# Patient Record
Sex: Male | Born: 1985 | Hispanic: Yes | Marital: Married
Health system: Northeastern US, Academic
[De-identification: ages and names within clinical notes are randomized; demographics above are authoritative.]

---

## 2010-08-02 IMAGING — US Abdomen
1 series · 14 of 16 positions shown · non-contrast
Comparison: none

[Series 1: abdomen · 0.31mm/px · 14 of 74 slices shown]
[im 1/74]
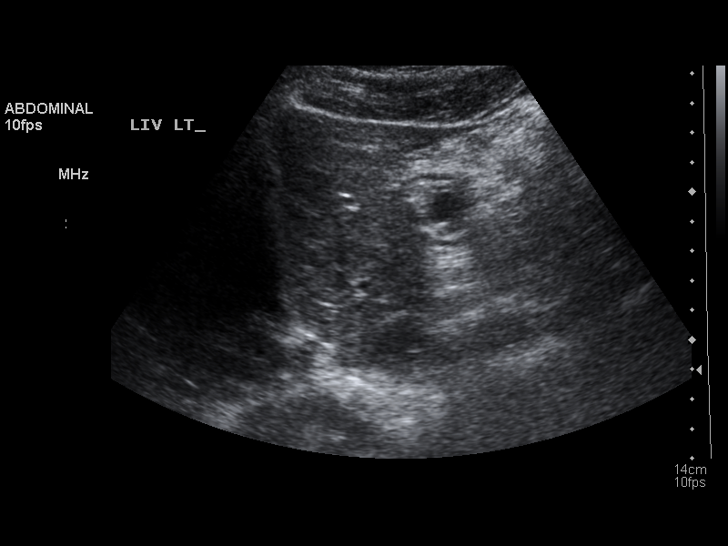
[im 5/74]
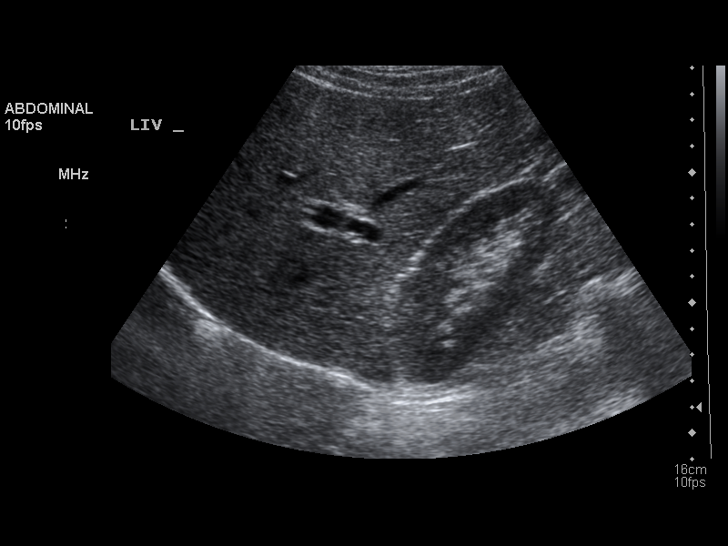
[im 10/74]
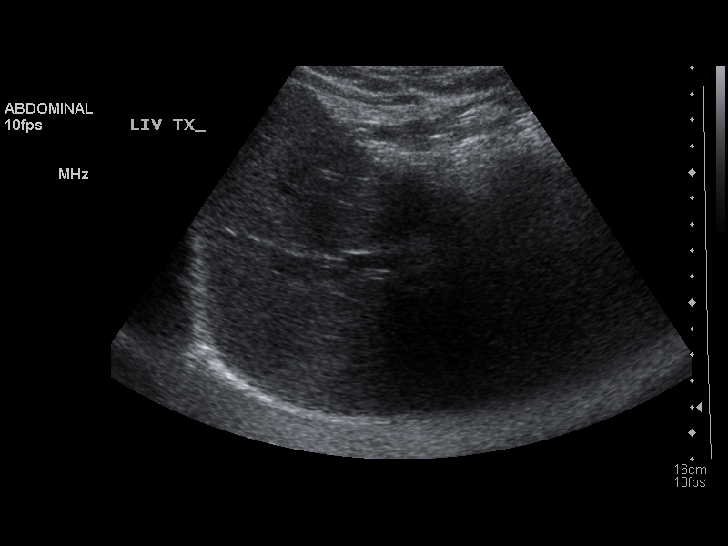
[im 20/74]
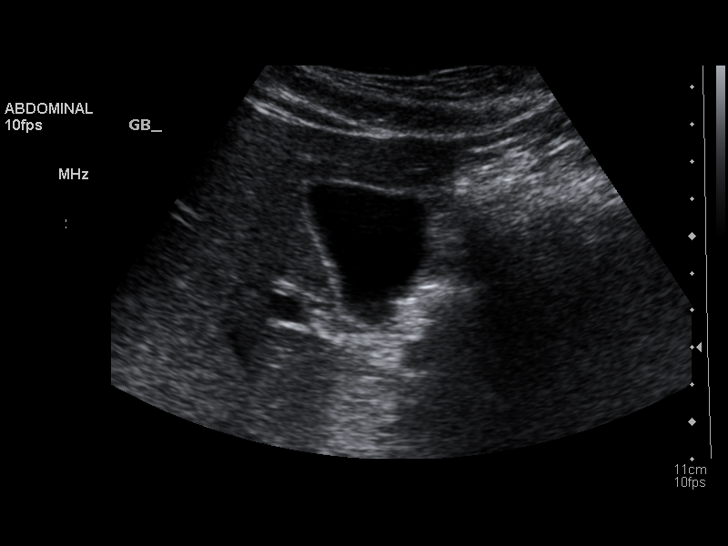
[im 25/74]
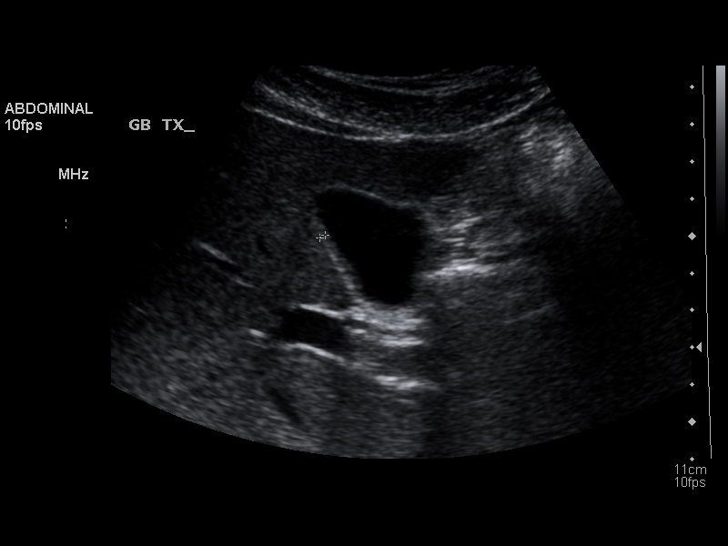
[im 30/74]
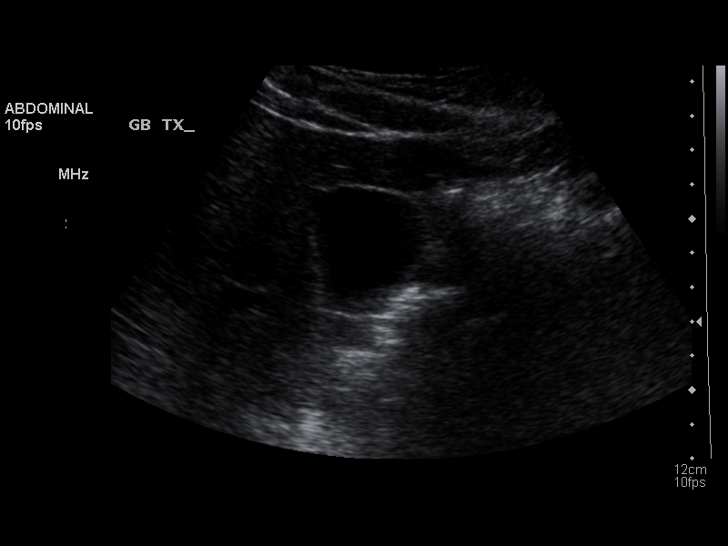
[im 35/74]
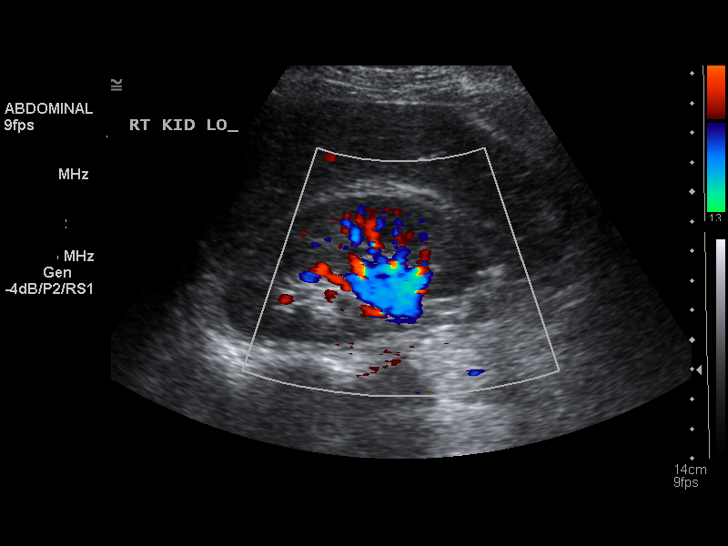
[im 39/74]
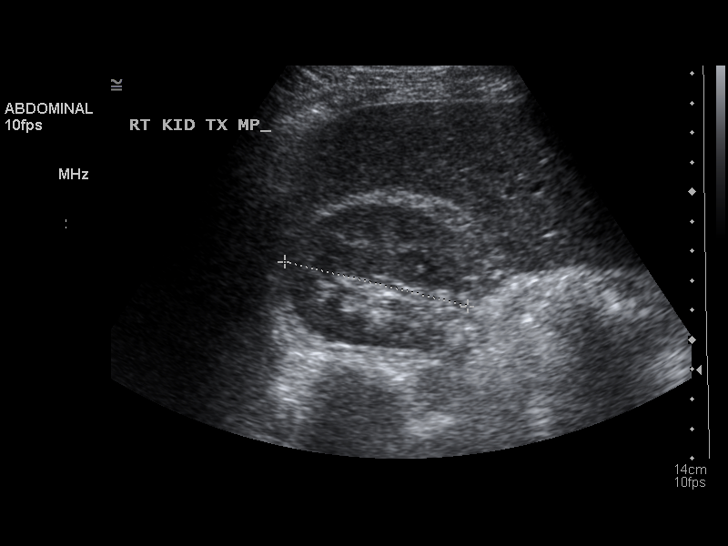
[im 44/74]
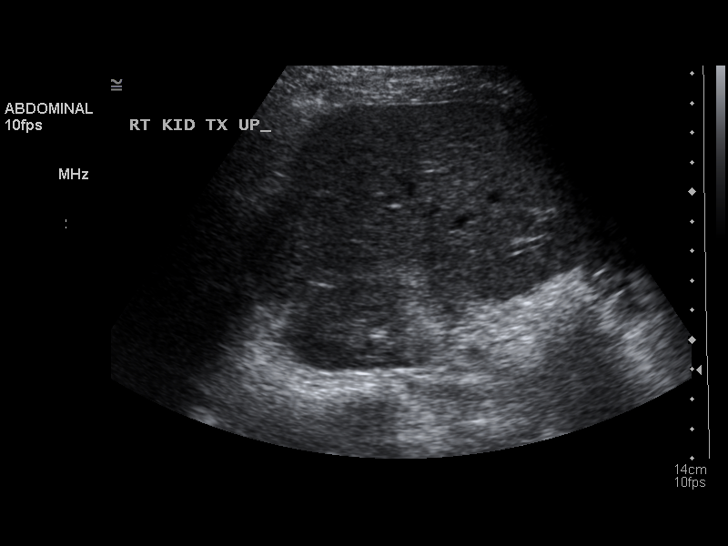
[im 49/74]
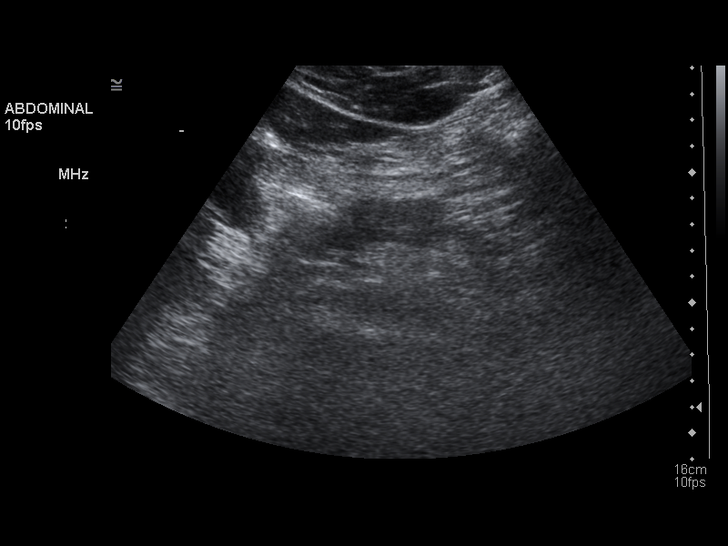
[im 59/74]
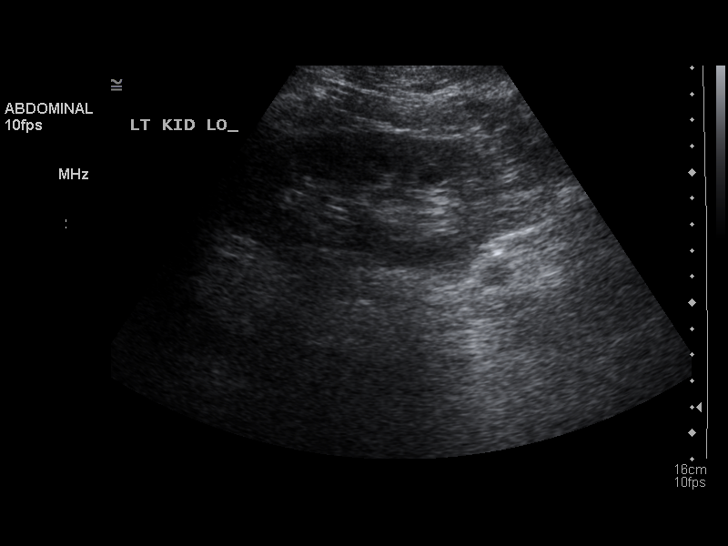
[im 64/74]
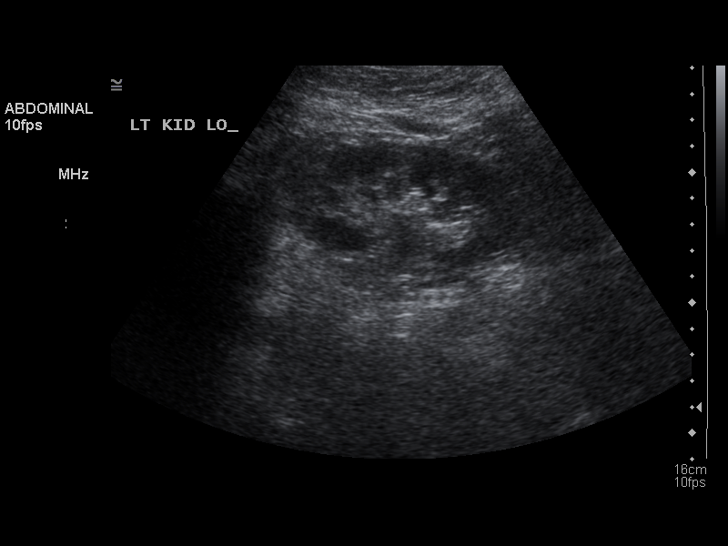
[im 69/74]
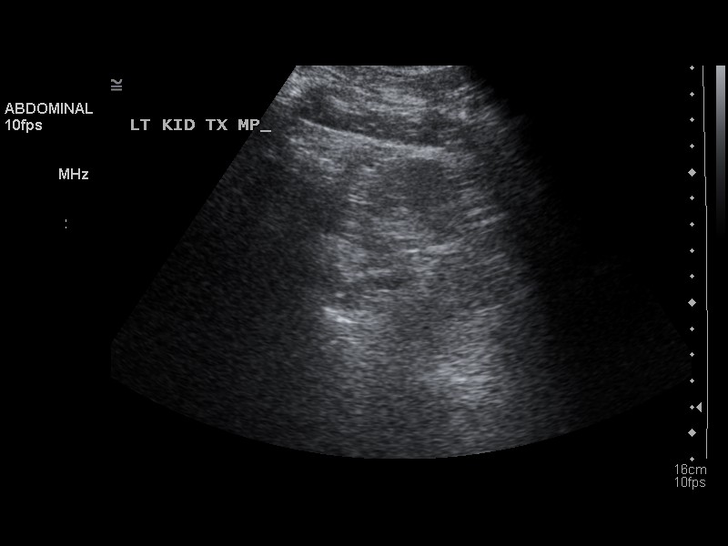
[im 74/74]
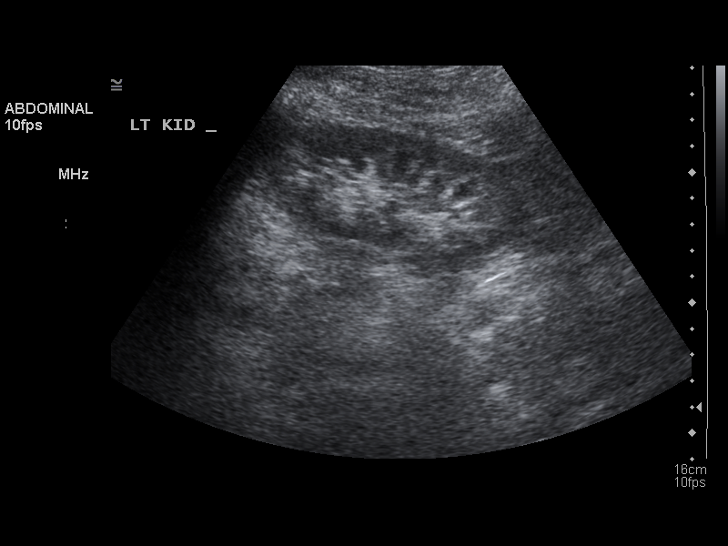

[14 of 16 positions shown; findings below may reference images not displayed]

ABDOMINAL AND LIMITED RENAL SONOGRAPHY-

There is no evidence of cholelithiasis.

The liver is normal in size with no evidence of a space-occupying lesion

or bile duct dilatation.  The pancreas is normal in size and echo

pattern.

Limited views of both kidneys are unremarkable.

IMPRESSION-

Negative abdominal and limited renal sonography.

## 2022-07-16 ENCOUNTER — Emergency Department: Admit: 2022-07-16 | Payer: PRIVATE HEALTH INSURANCE | Primary: Internal Medicine

## 2022-07-16 ENCOUNTER — Inpatient Hospital Stay
Admit: 2022-07-16 | Discharge: 2022-07-16 | Payer: PRIVATE HEALTH INSURANCE | Attending: Student in an Organized Health Care Education/Training Program

## 2022-07-16 DIAGNOSIS — M79673 Pain in unspecified foot: Secondary | ICD-10-CM

## 2022-07-16 DIAGNOSIS — Z113 Encounter for screening for infections with a predominantly sexual mode of transmission: Secondary | ICD-10-CM

## 2022-07-16 DIAGNOSIS — M79672 Pain in left foot: Secondary | ICD-10-CM

## 2022-07-16 DIAGNOSIS — R21 Rash and other nonspecific skin eruption: Secondary | ICD-10-CM

## 2022-07-16 DIAGNOSIS — N4889 Other specified disorders of penis: Secondary | ICD-10-CM

## 2022-07-16 LAB — UA REFLEX CULTURE

## 2022-07-16 LAB — URINALYSIS WITH CULTURE REFLEX      (BH LMW YH)
BKR BILIRUBIN, UA: NEGATIVE
BKR BLOOD, UA: NEGATIVE
BKR LEUKOCYTE ESTERASE, UA: NEGATIVE
BKR NITRITE, UA: NEGATIVE
BKR PH, UA: 5.5 (ref 5.5–7.5)
BKR PROTEIN, UA: NEGATIVE
BKR SPECIFIC GRAVITY, UA: >1.05 — ABNORMAL HIGH (ref 1.005–1.030)
BKR UROBILINOGEN, UA (MG/DL): <2 mg/dL (ref <=2.0)

## 2022-07-16 LAB — CHLAMYDIA TRACHOMATIS, NAAT (LAB ORDER ONLY) (BH GH L LMW YH): BKR CHLAMYDIA, DNA PROBE: NEGATIVE

## 2022-07-16 LAB — NEISSERIA GONORRHEA, NAAT (LAB ORDER ONLY)   (BH GH L LMW YH): BKR NEISSERIA GONORRHOEAE, DNA PROBE: NEGATIVE

## 2022-07-16 LAB — TREPONEMA PALLIDUM (SYPHILIS) ANTIBODY W/REFLEX
BKR TREPONEMA PALLIDUM ANTIBODY INITIAL RESULT: <0.1 Index
BKR TREPONEMA PALLIDUM ANTIBODY TOTAL, SERUM: NONREACTIVE

## 2022-07-16 MED ORDER — MICONAZOLE NITRATE 2 % TOPICAL CREAM
2 % | Freq: Two times a day (BID) | TOPICAL | Status: DC
Start: 2022-07-16 — End: 2022-07-16

## 2022-07-16 MED ORDER — MICONAZOLE NITRATE 2 % TOPICAL CREAM
2 % | Freq: Two times a day (BID) | TOPICAL | 1 refills | Status: AC
Start: 2022-07-16 — End: ?

## 2022-07-16 NOTE — ED Notes
4:05 AM Patient will follow up with the STD results.

## 2022-07-16 NOTE — Telephone Encounter
Urinalysis report received in ED follow up showing  4+ glucose 1+ ketones . Call to pt, informed. Pt denies hx of diabetes reports he has increased thirst and urination. Pt  advised to follow up , provided with phone number for [facility] and information and hours and location for [facility] convenient care. Pt informed sti results are pending . Discharge instructions including  prescription  and podiatry follow up reviewed. Pt advised  to return if symptoms persist or worsen or if he develops any other concerning symptoms.

## 2022-07-16 NOTE — Discharge Instructions
You were seen in the ED for your:  Foot pain, arm rash, penile discomfortTests that were done include:  Vitals, physical exam, x-ray.Specific discharge instructions include: Please follow up with your primary care provider as soon as possible.  A staff member we will call you to help you establish care at a primary doctor as well as for the results of any pending laboratory studies. Please return to the nearest emergency department or call 911 if you develop new or worsening symptoms such as: fevers, chills, trouble breathing, chest pain, abdominal pain, loss of consciousness, lightheadedness, inability to keep down foods or fluids, or if you have any other symptoms that concern you. Please follow-up with a primary care provider as soon as possible (tomorrow, if possible). If you are unable to schedule prompt follow-up with a primary care provider, we encourage you to return to the nearest emergency department.You can call 211 for essential community resources if needed. It was our pleasure meeting and taking care of you! Please let us know if there are any additional resources we can provide you today!

## 2022-07-28 NOTE — Unmapped
Chief Complaint Patient presents with  Foot Pain   Pt presents w/ L foot pain, states he is on his feet all night w/ his job, wears steal toe shoes and is causing pt pain to his heal. Also reports rash to L arm that is itchy.  Also reports penial pain x1 month. Pt A+O, VSS on ra JXB:[facility]YNW-[facility] HospitalEmergency Medicine Resident MDM: ----------------------------------------------------------------------------DISCLAIMER: Dictation device may have been used, please excuse any spelling, word substitution, or punctuation errors.Presentation: From EMR review [notes]: 37 year old male presenting with left foot pain and left arm rash that is pruritic.  Patient endorsing 1 month of perineal pain.On my encounter with patient: pt affirms the above with additional hx provided.  Patient states that when he has intercourse with his partner that some hair gets trapped between the foreskin and penis, patient is uncircumcised, and results and irritation but denies penile discharge or any white surrounding foreign substance.  Denies dysuria. Physical exam: See Below.Vitals: ReviewedConstitutional: AlertHENT: NormocephalicEyes: Pupils equal and round. CV: normal rate and regular rhythm. Heart sounds nlPulm: normal effort, breath sounds nlAbd: flat, soft, nontender, no guarding, no reboundNeuro: No FND, AAOx3Extremities:  See photos for upper extremities.  The lower extremities appear to be symmetric without edema patient is ambulatory. GU: chaperone [place], [place], RN, white substance located under foreskin, fully retractable. No penile discharge appreciated. Testicles non tender b/l. Ddx/MDM:Pt may be experiencing contact dermatitis of the upper extremities that appear to be in the healing stage.  Doubt bony pathology of the left lower extremity but will pain x-ray. Considered STI and till send urine and STI testing. Considered candidiasis and ordered antifungal. I sent antifungal to pharmacy. ED COURSE: Vitals obtained and physical exam conducted. Spoke with attending regarding pertinent findings and subsequent plan formed. Imaging ordered. I placed follow up request for patient laboratory studies and to assist in finding primary care provider. XR negative. Referral to podiatry made as well. This patient was presented to and discussed with the attending physician and a treatment plan and disposition were collaboratively agreed upon.Josue Minaya, M.D.Emergency MedicinePGY-1Available on MHB*This note may have been produced using M-Modal transcription software: please excuse any typos.---------------------------------------------------------------------------------------------------------------------  Physical ExamED Triage Vitals [07/15/22 2349]BP: (!) 144/89Pulse: (!) 101Pulse from  O2 sat: n/aResp: 18Temp: (!) 96.8 ?F (36 ?C)Temp src: n/aSpO2: 96 % BP (!) 144/89  - Pulse (!) 101  - Temp (!) 96.8 ?F (36 ?C)  - Resp 18  - SpO2 96% Physical Exam ProceduresAttestation/Critical CarePatient Reevaluation: Attending Supervised: ResidentI saw and examined the patient. I agree with the findings and plan of care as documented in the resident's note. 37 y.o. male presenting with multiple complaints. Difficulty with penile hygiene, no signs of yeast or other injections. Rash likely contact derm. Foot pain unremarkable on exam, sx tx for this. Plan for dc. Rush Farmer, MDClinical Impressions as of 07/16/22 0305 Pain of foot, unspecified laterality  ED DispositionDischarge Maricela Bo, MDResident06/17/24 0308 Maricela Bo, MDResident06/17/24 0309 Rush Farmer, MD06/29/24 1200

## 2022-08-09 ENCOUNTER — Ambulatory Visit: Admit: 2022-08-09 | Payer: PRIVATE HEALTH INSURANCE | Attending: Internal Medicine | Primary: Internal Medicine

## 2022-08-09 ENCOUNTER — Inpatient Hospital Stay: Admit: 2022-08-09 | Discharge: 2022-08-09 | Payer: PRIVATE HEALTH INSURANCE | Primary: Internal Medicine

## 2022-08-09 ENCOUNTER — Encounter: Admit: 2022-08-09 | Payer: PRIVATE HEALTH INSURANCE | Attending: Internal Medicine | Primary: Internal Medicine

## 2022-08-09 DIAGNOSIS — Z0000 Encounter for general adult medical examination without abnormal findings: Secondary | ICD-10-CM

## 2022-08-10 LAB — VITAMIN B12: BKR VITAMIN B12: 455 pg/mL (ref 232–1245)

## 2022-08-10 LAB — FOLATE: BKR FOLATE: 19.6 ng/mL

## 2022-10-16 ENCOUNTER — Ambulatory Visit: Admit: 2022-10-16 | Payer: PRIVATE HEALTH INSURANCE | Attending: Urology | Primary: Internal Medicine

## 2022-10-24 ENCOUNTER — Telehealth: Admit: 2022-10-24 | Payer: PRIVATE HEALTH INSURANCE | Attending: Urology | Primary: Internal Medicine

## 2022-10-24 NOTE — Telephone Encounter
 Copied from CRM [id number]. Topic: Scheduling - Schedule Appointment>> Oct 24, 2022  3:10 PM Cohen Boettner R wrote:Placing outbound call to OZZIEL, KOBZA [facility] to schedule an appointment. Spoke with patient and scheduled new patient appointment with Dr. Jennye Moccasin on 12/17/22.

## 2022-12-04 ENCOUNTER — Ambulatory Visit: Admit: 2022-12-04 | Payer: PRIVATE HEALTH INSURANCE | Attending: Foot & Ankle Surgery | Primary: Internal Medicine

## 2022-12-17 ENCOUNTER — Ambulatory Visit: Admit: 2022-12-17 | Payer: PRIVATE HEALTH INSURANCE | Attending: Urology | Primary: Internal Medicine

## 2022-12-17 ENCOUNTER — Encounter: Admit: 2022-12-17 | Payer: PRIVATE HEALTH INSURANCE | Attending: Urology | Primary: Internal Medicine

## 2022-12-17 VITALS — Ht 67.0 in

## 2022-12-17 NOTE — Progress Notes
 NEW PATIENT NOTEREFERRED by: Dr. Maebelle Munroe FOR VISIT: phimosisHistory of Present Illness: Spencer Griffin is a 37 y.o. male  presents with a long-standing issue of discomfort during sexual intercourse due to his foreskin. He reports multiple instances where his pubic hair has become trapped under the foreskin, causing irritation and pain. He has previously sought medical attention for this issue and was prescribed a cream for relief. However, the discomfort persists, particularly after sexual activity. He also notes that the foreskin has healed tight after an episode of swelling and redness, which he believes was exacerbated by continued sexual activity despite initial soreness. The patient has expressed a desire for circumcision to alleviate these issues.      No data to display    No results found for: PVRPOCT   No data to display    Failed to redirect to the Timeline version of the REVFS SmartLink.Failed to redirect to the Timeline version of the REVFS SmartLink.Past Medical HistoryNo past medical history on file.Past Surgical HistoryNo past surgical history on file.AllergiesNo Known AllergiesMedicationsOutpatient Encounter Medications as of 12/17/2022 Medication Sig Dispense Refill  metFORMIN (GLUCOPHAGE) 500 mg Immediate Release tablet Take 1 pill daily for 1 week, if you do not acve side effects, you can increase to two pills a day (once in the morning and once in the evening) (Patient not taking: Reported on 12/17/2022) 60 tablet 11  miconazole nitrate (SECURA EXTRA THICK ANTIFUNGAL) 2 % cream Apply topically 2 (two) times daily. (Patient not taking: Reported on 12/17/2022) 28.35 g 0 No facility-administered encounter medications on file as of 12/17/2022.  Social HistorySocial History Tobacco Use  Smoking status: Former   Types: Cigarettes Substance Use Topics  Alcohol use: Yes Drug use: Never  Family History History reviewed. No pertinent family history.Review of SystemsGeneral: Denies appetite changes, fatigue, chills, fevers, headache or weight loss. Cardiovascular: Denies chest pain, irregular pulse, ankle swelling, HTN, MI or heart murmursGastrointestinal: Denies abdominal pain, black stools, constipation, or diarrhea, Endocrine: Denies excessive thirst, diabetes, or hyper/hypothyroidismRespiratory: Denies wheezing, cough, asthma, or SOBSkin: Denies rashes or itchingNeurologic: Denies dizziness, numbness, seizures, memory loss, fainting, difficulty speaking or disorientation.  Urinary: as aboveMusculoskeletal: Denies joint pain, stiffness or back painHematologic: Denies easily bruising, bleeding, yellow skin, poor wound healing, swollen glands, anemia, or lymphomaPhysical ExamHt 5' 7 (1.702 m)  - BMI 30.54 kg/m? Constitutional: Oriented to person, place, and time. Appears well-developed and well-nourished. Cardiac: RRRPulmonary/Chest: Effort normal to breath. Clear, no wheezes Abdominal: Soft and non-tenderGU: uncircumcised Penis with an orthotopic meatus. Able to retract foreskin with some effort but this is painful for the patient. No evidence of urethral discharge. No palpable penile plaques or penile masses. The scrotum is without masses. Both testicles are descended and without appreciable mass or tenderness. No evidence of hydrocele or hernia bilaterally.MSK: normal gaitLABS:No results found for: UA, LABURINIMAGING:No results found.Assessment & Plan:Spencer Griffin is a 37 y.o. 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 Previous topical cream treatment provided partial relief. Physical exam shows slight narrowing and tightness of the foreskin. Discussed circumcision, including post-operative soreness, swelling, and six-week sexual activity restriction for healing. Potential complications include bleeding, wound breakdown, and infection, possibly extending healing to three to four months. Circumcision does not affect erection or desire but may initially alter glans sensitivity. - patient elected to pursue circumcision- Schedule circumcision for January 2025- Advise no sexual activity for six weeks post-circumcision- Discuss potential complications  of circumcision including bleeding, wound breakdown, and infection No diagnosis found.No orders of the defined types were placed in this encounter.Patient will let us know if any new problems or symptoms arise in the interimJoshua Lesly Pontarelli, MD, MScAssistant Professor of [employer] of MedicineT:[phone] of this note were dictated using M*Modal transcription software with attempt to correct any dictation errors.  Please feel free to contact for any clarifications.I provided a concise overview of the ambient note generation solution. [name] or their legally authorized representative verbally consented to a temporary audio recording of their visit to assist with the completion of the visit documentation. This note was completed using an AI-powered solution and reviewed by Ria Clock, MD for accuracy.

## 2022-12-18 DIAGNOSIS — Z789 Other specified health status: Secondary | ICD-10-CM

## 2022-12-18 DIAGNOSIS — N471 Phimosis: Secondary | ICD-10-CM

## 2023-01-04 ENCOUNTER — Telehealth: Admit: 2023-01-04 | Payer: PRIVATE HEALTH INSURANCE | Attending: Urology | Primary: Internal Medicine

## 2023-01-04 ENCOUNTER — Inpatient Hospital Stay: Admit: 2023-01-04 | Payer: PRIVATE HEALTH INSURANCE | Primary: Internal Medicine

## 2023-01-04 NOTE — Telephone Encounter
 Copied from CRM #[id number]. Topic: General Message - [facility]>> Jan 04, 2023 10:33 AM [name] wrote:[facility] MESSAGETime of call:   10:33 AMCaller:   Spencer Griffin for call:   Patient returned miss call and request a call back to schedule. Please call patient to schedule.Best telephone number for callback:   [phone] time to return call:   Any Permission to leave message:  yes   [facility]

## 2023-01-04 NOTE — Telephone Encounter
Left voicemail for the patient to return the call to schedule surgery with Dr. Sterling.

## 2023-01-17 ENCOUNTER — Telehealth: Admit: 2023-01-17 | Payer: PRIVATE HEALTH INSURANCE | Attending: Urology | Primary: Internal Medicine

## 2023-01-17 NOTE — Telephone Encounter
 Spoke with the patient and confirmed surgery date of 02/22/2023 with Dr. Jennye Moccasin.Advised the patient they will get a call the day before surgery with their arrival time.  Encouraged to call [phone] with any questions.Care Center:  Please schedule an office appointment 3 weeks post op.  (I am unable to schedule post op appointments at this time but this is being worked on.)

## 2023-02-11 ENCOUNTER — Encounter: Admit: 2023-02-11 | Payer: PRIVATE HEALTH INSURANCE | Primary: Internal Medicine

## 2023-02-25 ENCOUNTER — Telehealth: Admit: 2023-02-25 | Payer: PRIVATE HEALTH INSURANCE | Attending: Urology | Primary: Internal Medicine

## 2023-02-25 NOTE — Telephone Encounter
 LVM for patient to discuss rescheduling surgery that was canceled on 02/22/2023 with Dr. Jennye Moccasin, possibly on 03/01/2023.

## 2023-02-28 ENCOUNTER — Telehealth: Admit: 2023-02-28 | Payer: PRIVATE HEALTH INSURANCE | Attending: Urology | Primary: Internal Medicine

## 2023-02-28 NOTE — Telephone Encounter
 Spoke with the patient to discuss scheduling surgery with Dr. Jennye Moccasin possibly on 3/7 or 3/14.  The patient states he does not want surgery at this time and will call back if/when he is ready to schedule.

## 2023-04-02 ENCOUNTER — Ambulatory Visit: Admit: 2023-04-02 | Payer: PRIVATE HEALTH INSURANCE | Attending: Urology | Primary: Internal Medicine
